# Patient Record
Sex: Male | Born: 1970 | Hispanic: No | Marital: Married | Smoking: Former smoker
Health system: Southern US, Community
[De-identification: ages and names within clinical notes are randomized; demographics above are authoritative.]

## PROBLEM LIST (undated history)

## (undated) DIAGNOSIS — IMO0001 Reserved for inherently not codable concepts without codable children: Secondary | ICD-10-CM

## (undated) DIAGNOSIS — I10 Essential (primary) hypertension: Secondary | ICD-10-CM

## (undated) DIAGNOSIS — K219 Gastro-esophageal reflux disease without esophagitis: Secondary | ICD-10-CM

## (undated) DIAGNOSIS — F329 Major depressive disorder, single episode, unspecified: Secondary | ICD-10-CM

## (undated) DIAGNOSIS — F32A Depression, unspecified: Secondary | ICD-10-CM

## (undated) HISTORY — DX: Essential (primary) hypertension: I10

## (undated) HISTORY — DX: Gastro-esophageal reflux disease without esophagitis: K21.9

## (undated) HISTORY — DX: Major depressive disorder, single episode, unspecified: F32.9

## (undated) HISTORY — DX: Depression, unspecified: F32.A

## (undated) HISTORY — DX: Reserved for inherently not codable concepts without codable children: IMO0001

---

## 2010-07-18 ENCOUNTER — Ambulatory Visit (HOSPITAL_COMMUNITY): Payer: Self-pay | Admitting: Licensed Clinical Social Worker

## 2010-07-22 ENCOUNTER — Ambulatory Visit (HOSPITAL_COMMUNITY): Payer: Self-pay | Admitting: Licensed Clinical Social Worker

## 2010-07-29 ENCOUNTER — Ambulatory Visit (HOSPITAL_COMMUNITY): Payer: Self-pay | Admitting: Licensed Clinical Social Worker

## 2010-08-05 ENCOUNTER — Ambulatory Visit (HOSPITAL_COMMUNITY): Payer: Self-pay | Admitting: Licensed Clinical Social Worker

## 2010-08-26 ENCOUNTER — Ambulatory Visit (HOSPITAL_COMMUNITY): Payer: Self-pay | Admitting: Licensed Clinical Social Worker

## 2010-09-09 ENCOUNTER — Ambulatory Visit (HOSPITAL_COMMUNITY): Payer: Self-pay | Admitting: Licensed Clinical Social Worker

## 2010-09-23 ENCOUNTER — Ambulatory Visit (HOSPITAL_COMMUNITY): Payer: Self-pay | Admitting: Licensed Clinical Social Worker

## 2010-10-08 ENCOUNTER — Ambulatory Visit (HOSPITAL_COMMUNITY): Admit: 2010-10-08 | Payer: Self-pay | Admitting: Licensed Clinical Social Worker

## 2010-10-22 ENCOUNTER — Ambulatory Visit (HOSPITAL_COMMUNITY)
Admission: RE | Admit: 2010-10-22 | Discharge: 2010-10-22 | Payer: Self-pay | Source: Home / Self Care | Attending: Licensed Clinical Social Worker | Admitting: Licensed Clinical Social Worker

## 2011-09-04 ENCOUNTER — Encounter (HOSPITAL_COMMUNITY): Payer: Self-pay | Admitting: Licensed Clinical Social Worker

## 2011-09-04 ENCOUNTER — Ambulatory Visit (INDEPENDENT_AMBULATORY_CARE_PROVIDER_SITE_OTHER): Payer: 59 | Admitting: Licensed Clinical Social Worker

## 2011-09-04 DIAGNOSIS — F329 Major depressive disorder, single episode, unspecified: Secondary | ICD-10-CM

## 2011-09-04 DIAGNOSIS — F3289 Other specified depressive episodes: Secondary | ICD-10-CM

## 2011-09-04 DIAGNOSIS — F32A Depression, unspecified: Secondary | ICD-10-CM

## 2011-09-04 NOTE — Patient Instructions (Signed)
Discussed different ways to deal with children       Choice      Set consequences      Lower Discipline role      Increase fun times.      When stressed talk with wife about switching roles Anger issues are around control       Triggers - continue to define        Powerlessness        Feeling disregarded Work on ways to walk away

## 2011-09-11 ENCOUNTER — Ambulatory Visit (HOSPITAL_COMMUNITY): Payer: Self-pay | Admitting: Licensed Clinical Social Worker

## 2011-09-15 DIAGNOSIS — F32A Depression, unspecified: Secondary | ICD-10-CM | POA: Insufficient documentation

## 2011-09-15 DIAGNOSIS — F329 Major depressive disorder, single episode, unspecified: Secondary | ICD-10-CM | POA: Insufficient documentation

## 2011-09-15 NOTE — Progress Notes (Signed)
   THERAPIST PROGRESS NOTE  Session Time: 9:08 - 10:00  Participation Level: Active  Behavioral Response: Fairly GroomedAlertAnxious and Depressed  Type of Therapy: Individual Therapy  Treatment Goals addressed: Anger and Anxiety  Interventions: Supportive, Anger Management Training, Family Systems and Reframing  Summary: Eddie Galvan is a 40 y.o. male who presents with anxiety and concern about his behavior with the children especially his son. He was in therapy with this counselor from 06/2010 - 09/2010.  He had gotten what he needed at the time and was doing better.  Had planned to return if needs arose.  He is returning for he finds himself having difficulty with his anger again.  He is getting stuck in the disciplinary role so we discussed getting away from that for awhile - discuss some changes with wife.  He needs to figure out ways to have fun with the children.  He is always feeling responsible and having to get control.Marland Kitchen  His triggers for anger are feeling powerless, disregarded - all around feeling out of control.  Discussed when he is stressed have wife handle discipline.  Talk to children about choice you "can do or have this" or "lose this".  Set up consequences ahead of time - work with wife around rules and consequences - he is disciplining too much off the top of his head.  It is ok to leave and tell them they will get their consequence later - sitting with discomfort never hurt anyone.  He has to work on keeping himself under control.  Increase fun activities with children.  Given list of instructions.   Suicidal/Homicidal: Nowithout intent/plan  Plan: Return again in 1 weeks.  Diagnosis: Axis I: Depressive Disorder NOS    Axis II: Deferred    Stepehn Eckard,JUDITH A, LCSW 09/15/2011

## 2011-10-01 ENCOUNTER — Ambulatory Visit (INDEPENDENT_AMBULATORY_CARE_PROVIDER_SITE_OTHER): Payer: 59 | Admitting: Licensed Clinical Social Worker

## 2011-10-01 ENCOUNTER — Encounter (HOSPITAL_COMMUNITY): Payer: Self-pay | Admitting: Licensed Clinical Social Worker

## 2011-10-01 DIAGNOSIS — F329 Major depressive disorder, single episode, unspecified: Secondary | ICD-10-CM

## 2011-10-01 NOTE — Patient Instructions (Signed)
Stop attending to negative benaviors Attend to positive behaviors Give him a special place in family - the tech guru Work on staying present -  When younger kids tattle - do not respond - if the behaviors are worth consequences - give to all involved
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
## 2011-12-29 ENCOUNTER — Ambulatory Visit (HOSPITAL_COMMUNITY): Payer: Self-pay | Admitting: Licensed Clinical Social Worker

## 2014-05-01 ENCOUNTER — Ambulatory Visit (INDEPENDENT_AMBULATORY_CARE_PROVIDER_SITE_OTHER): Payer: BC Managed Care – PPO | Admitting: Psychiatry

## 2014-05-01 ENCOUNTER — Encounter (HOSPITAL_COMMUNITY): Payer: Self-pay | Admitting: Psychiatry

## 2014-05-01 ENCOUNTER — Encounter (INDEPENDENT_AMBULATORY_CARE_PROVIDER_SITE_OTHER): Payer: Self-pay

## 2014-05-01 VITALS — BP 150/90 | HR 90 | Ht 70.5 in | Wt 203.0 lb

## 2014-05-01 DIAGNOSIS — F339 Major depressive disorder, recurrent, unspecified: Secondary | ICD-10-CM | POA: Insufficient documentation

## 2014-05-01 MED ORDER — CITALOPRAM HYDROBROMIDE 20 MG PO TABS: ORAL_TABLET | ORAL | Status: DC
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
## 2014-05-01 NOTE — Patient Instructions (Signed)
Caution about headache and nausea in the beginning. Also recommend therapy.

## 2014-05-22 ENCOUNTER — Ambulatory Visit (INDEPENDENT_AMBULATORY_CARE_PROVIDER_SITE_OTHER): Payer: BC Managed Care – PPO | Admitting: Psychiatry

## 2014-05-22 ENCOUNTER — Encounter (HOSPITAL_COMMUNITY): Payer: Self-pay | Admitting: Psychiatry

## 2014-05-22 VITALS — BP 150/90 | HR 86 | Ht 70.0 in | Wt 202.0 lb

## 2014-05-22 DIAGNOSIS — F331 Major depressive disorder, recurrent, moderate: Secondary | ICD-10-CM

## 2014-05-22 DIAGNOSIS — F339 Major depressive disorder, recurrent, unspecified: Secondary | ICD-10-CM

## 2014-05-22 MED ORDER — CITALOPRAM HYDROBROMIDE 20 MG PO TABS: ORAL_TABLET | ORAL | Status: DC

## 2014-05-22 NOTE — Progress Notes (Signed)
Patient ID: Eddie Galvan, male   DOB: 11-18-1970, 43 y.o.   MRN: [id number] [facility] Follow-up Outpatient Visit  Eddie Galvan [id number] 43 y.o.  05/22/2014  Chief Complaint: Depression follow up.     History of Present Illness:   Patient returns for Medication Follow up and is diagnosed with Depression.  Patient was having conflicts at home. Relationship is getting distant. He was getting angry and frustrated for which he came into appointment we have started on Celexa last visit. Things are getting better as Desitin down together and talking about the conflicts and finances. He believes medication is helping and there are no reported side effects. Energy level, appetite, concentration and sleep are adequate Context of depression related to the current relationship and family psychosocial issues. Timing of depression is variable now.  . Severity of depression improved to  7/10.  In the past he is self-medicating himself when he was young during his teenage years alcohol marijuana as of now is not using alcohol on a regular basis. He has not used marijuana for a long time.  He endorses having some flashbacks or memories about the past from his childhood when he was growing because they were mean to him. He feels sometimes he gets angry at the kids but does not want to be like that kind of experiences that he has had when he was young  No current panic symptoms does worry a lot but does not endorse hopelessness. Does not endorse hallucinations or delusions. Does not endorse having racing thoughts the point of having manic symptoms or psychotic symptoms or paranoia.   Past Medical History  Diagnosis Date  . Hypertension   . Reflux   . Depression   . GERD (gastroesophageal reflux disease)   . High blood pressure    Family History  Problem Relation Age of Onset  . Alcohol abuse Maternal Grandmother   . Depression Paternal Grandfather   . Depression Brother   .  Anxiety disorder Brother     Outpatient Encounter Prescriptions as of 05/22/2014  Medication Sig  . aspirin 81 MG tablet Take 81 mg by mouth daily.  . citalopram (CELEXA) 20 MG tablet Take one tablet a day.  . lisinopril (PRINIVIL,ZESTRIL) 10 MG tablet Take 10 mg by mouth daily.  Marland Kitchen omeprazole (PRILOSEC) 10 MG capsule Take 10 mg by mouth daily.  . [DISCONTINUED] citalopram (CELEXA) 20 MG tablet Take half table first 5 days then one day.    No results found for this or any previous visit (from the past 2160 hour(s)).  BP 150/90  Pulse 86  Ht  (1.778 m)  Wt 202 lb (91.627 kg)  BMI 28.98 kg/m2   Review of Systems  Constitutional: Negative.   Neurological: Negative for dizziness and tremors.  Psychiatric/Behavioral: Positive for depression. Negative for suicidal ideas, hallucinations and substance abuse. The patient is nervous/anxious.     Mental Status Examination  Appearance: casual Alert: Yes Attention: fair  Cooperative: Yes Eye Contact: Good Speech: normal  Psychomotor Activity: Normal Memory/Concentration: reasonable Oriented: person, place and time/date Mood: Euthymic Affect: Congruent Thought Processes and Associations: Intact Fund of Knowledge: Fair Thought Content: Suicidal ideation and Homicidal ideation were denied. Insight: Fair Judgement: Fair  Diagnosis: Maj. depressive disorder recurrent moderate.  Treatment Plan:   Continue Celexa 10 mg. Discussed to avail opportunity attend therapy. He is correct in therapy at [facility]. Follow up with his primary care physician regarding his blood pressure.  Pertinent Labs and Relevant  Prior Notes reviewed. Medication Side effects, benefits and risks reviewed/discussed with Patient. Time given for patient to respond and asks questions regarding the Diagnosis and Medications. Safety concerns and to report to ER if suicidal or call 911. Relevant Medications refilled or called in to  pharmacy. Discussed weight maintenance and Sleep Hygiene. Follow up with Primary care provider in regards to Medical conditions. Recommend compliance with medications and follow up office appointments. Discussed to avail opportunity to consider or/and continue Individual therapy with Counselor. Greater than 50% of time was spend in counseling and coordination of care with the patient.  Schedule for Follow up visit in 8 weeks or call in earlier as necessary.  Thresa Ross, MD 05/22/2014

## 2014-07-23 ENCOUNTER — Ambulatory Visit (HOSPITAL_COMMUNITY): Payer: Self-pay | Admitting: Psychiatry

## 2014-07-24 ENCOUNTER — Encounter (INDEPENDENT_AMBULATORY_CARE_PROVIDER_SITE_OTHER): Payer: Self-pay

## 2014-07-24 ENCOUNTER — Ambulatory Visit (INDEPENDENT_AMBULATORY_CARE_PROVIDER_SITE_OTHER): Payer: BC Managed Care – PPO | Admitting: Psychiatry

## 2014-07-24 ENCOUNTER — Encounter (HOSPITAL_COMMUNITY): Payer: Self-pay | Admitting: Psychiatry

## 2014-07-24 VITALS — BP 150/96 | HR 75 | Ht 70.5 in | Wt 211.0 lb

## 2014-07-24 DIAGNOSIS — F331 Major depressive disorder, recurrent, moderate: Secondary | ICD-10-CM

## 2014-07-24 MED ORDER — CITALOPRAM HYDROBROMIDE 20 MG PO TABS: ORAL_TABLET | ORAL | Status: DC

## 2014-07-24 NOTE — Progress Notes (Signed)
Patient ID: Eddie Galvan [place]ep[place], male   DOB: 10/08/1970, 43 y.o.   MRN: [id number] [facility] Follow-up Outpatient Visit  Eddie Galvan Breach [id number] 43 y.o.  07/24/2014  Chief Complaint: Depression follow up.     History of Present Illness:   Patient returns for Medication Follow up and is diagnosed with Depression.  Patient was having conflicts at home. Relationship is getting distant. He was getting angry and frustrated for which he came into appointment we have started on Celexa dose of which is now 20mg . Things are getting better  about the conflicts and finances. He believes medication is helping and there are no reported side effects. Energy level, appetite, concentration and sleep are adequate Context of depression related to the current relationship and family psychosocial issues. Timing of depression is variable now.  . Severity of depression improved to  7/10.  In the past he is self-medicating himself when he was young during his teenage years alcohol marijuana as of now is not using alcohol on a regular basis. He has not used marijuana for a long time.  He endorses having some flashbacks or memories about the past from his childhood when he was growing because they were mean to him. He feels sometimes he gets angry at the kids but does not want to be like that kind of experiences that he has had when he was young  No current panic symptoms does worry a lot but does not endorse hopelessness. Does not endorse hallucinations or delusions. Does not endorse having racing thoughts the point of having manic symptoms or psychotic symptoms or paranoia.   Past Medical History  Diagnosis Date  . Hypertension   . Reflux   . Depression   . GERD (gastroesophageal reflux disease)   . High blood pressure    Family History  Problem Relation Age of Onset  . Alcohol abuse Maternal Grandmother   . Depression Paternal Grandfather   . Depression Brother   . Anxiety disorder Brother      Outpatient Encounter Prescriptions as of 07/24/2014  Medication Sig  . aspirin 81 MG tablet Take 81 mg by mouth daily.  . citalopram (CELEXA) 20 MG tablet Take one tablet a day.  . lisinopril (PRINIVIL,ZESTRIL) 10 MG tablet Take 10 mg by mouth daily.  Marland Kitchen. omeprazole (PRILOSEC) 10 MG capsule Take 10 mg by mouth daily.  . [DISCONTINUED] citalopram (CELEXA) 20 MG tablet Take one tablet a day.    No results found for this or any previous visit (from the past 2160 hour(s)).  BP 150/96  Pulse 75  Ht 5' 10.5" (1.791 m)  Wt 211 lb (95.709 kg)  BMI 29.84 kg/m2   Review of Systems  Constitutional: Negative.   Neurological: Negative for dizziness and tremors.  Psychiatric/Behavioral: Negative for depression, suicidal ideas, hallucinations and substance abuse. The patient is not nervous/anxious.     Mental Status Examination  Appearance: casual Alert: Yes Attention: fair  Cooperative: Yes Eye Contact: Good Speech: normal  Psychomotor Activity: Normal Memory/Concentration: reasonable Oriented: person, place and time/date Mood: Euthymic Affect: Congruent Thought Processes and Associations: Intact Fund of Knowledge: Fair Thought Content: Suicidal ideation and Homicidal ideation were denied. Insight: Fair Judgement: Fair  Diagnosis: Maj. depressive disorder recurrent moderate.  Treatment Plan:   Continue Celexa 20 mg. Discussed to avail opportunity attend therapy. He is correct in therapy at [facility]. Follow up with his primary care physician regarding his blood pressure. [facility] and is helping  with conflicts at home.  Pertinent Labs and Relevant Prior Notes reviewed. Medication Side effects, benefits and risks reviewed/discussed with Patient. Time given for patient to respond and asks questions regarding the Diagnosis and Medications. Safety concerns and to report to ER if suicidal or call 911. Relevant  Medications refilled or called in to pharmacy. Discussed weight maintenance and Sleep Hygiene. Follow up with Primary care provider in regards to Medical conditions. Recommend compliance with medications and follow up office appointments. Discussed to avail opportunity to consider or/and continue Individual therapy with Counselor. Greater than 50% of time was spend in counseling and coordination of care with the patient.  Schedule for Follow up visit in 8 weeks or call in earlier as necessary.  Thresa RossAKHTAR, Adrie Picking, MD 07/24/2014

## 2014-10-02 ENCOUNTER — Encounter (INDEPENDENT_AMBULATORY_CARE_PROVIDER_SITE_OTHER): Payer: Self-pay

## 2014-10-02 ENCOUNTER — Encounter (HOSPITAL_COMMUNITY): Payer: Self-pay | Admitting: Psychiatry

## 2014-10-02 ENCOUNTER — Ambulatory Visit (INDEPENDENT_AMBULATORY_CARE_PROVIDER_SITE_OTHER): Payer: BLUE CROSS/BLUE SHIELD | Admitting: Psychiatry

## 2014-10-02 VITALS — BP 140/88 | HR 88 | Wt 204.0 lb

## 2014-10-02 DIAGNOSIS — F331 Major depressive disorder, recurrent, moderate: Secondary | ICD-10-CM

## 2014-10-02 MED ORDER — CITALOPRAM HYDROBROMIDE 20 MG PO TABS: ORAL_TABLET | ORAL | Status: DC

## 2014-10-02 NOTE — Progress Notes (Signed)
Patient ID: Eddie StanfordJonathon Galvan, male   DOB: 07/22/1971, 44 y.o.   MRN: [id number] [facility]  Eddie StanfordJonathon Galvan [id number] 44 y.o.  10/02/2014  Chief Complaint: Depression follow up.     History of Present Illness:   Patient returns for Medication Follow up and is diagnosed with Depression.  Patient was having conflicts at home. Relationship is getting distant. He was getting angry and frustrated for which he came into appointment we have started on Celexa dose of which is now 20mg . Things are getting better  about the conflicts and finances. He believes medication is helping and there are no reported side effects. Energy level, appetite, concentration and sleep are adequate. Continues to do better and seeing an outside therapist.  Context of depression related to the current relationship and family psychosocial issues. Timing of depression is variable now.  . Severity of depression improved to  7/10. 10 being no depression.   In the past he is self-medicating himself when he was young during his teenage years alcohol marijuana as of now is not using alcohol on a regular basis. He has not used marijuana for a long time.  He endorses having some flashbacks or memories about the past from his childhood when he was growing because they were mean to him. He feels sometimes he gets angry at the kids but does not want to be like that kind of experiences that he has had when he was young  No current panic symptoms does worry a lot but does not endorse hopelessness. Does not endorse hallucinations or delusions. Does not endorse having racing thoughts the point of having manic symptoms or psychotic symptoms or paranoia.   Past Medical History  Diagnosis Date  . Hypertension   . Reflux   . Depression   . GERD (gastroesophageal reflux disease)   . High blood pressure    Family History  Problem Relation Age of Onset  . Alcohol abuse Maternal Grandmother   .  Depression Paternal Grandfather   . Depression Brother   . Anxiety disorder Brother     Outpatient Encounter Prescriptions as of 10/02/2014  Medication Sig  . aspirin 81 MG tablet Take 81 mg by mouth daily.  . citalopram (CELEXA) 20 MG tablet Take one tablet a day.  . lisinopril (PRINIVIL,ZESTRIL) 10 MG tablet Take 10 mg by mouth daily.  Marland Kitchen. omeprazole (PRILOSEC) 10 MG capsule Take 10 mg by mouth daily.  . [DISCONTINUED] citalopram (CELEXA) 20 MG tablet Take one tablet a day.    No results found for this or any previous visit (from the past 2160 hour(s)).  BP 140/88 mmHg  Pulse 88  Wt 204 lb (92.534 kg)   Review of Systems  Constitutional: Negative.   Neurological: Negative for dizziness and tremors.  Psychiatric/Behavioral: Negative for depression, suicidal ideas, hallucinations and substance abuse. The patient is not nervous/anxious.    GI: no nausea, vomiting Mental Status Examination  Appearance: casual Alert: Yes Attention: fair  Cooperative: Yes Eye Contact: Good Speech: normal  Psychomotor Activity: Normal Memory/Concentration: reasonable Oriented: person, place and time/date Mood: Euthymic Affect: Congruent Thought Processes and Associations: Intact Fund of Knowledge: Fair Thought Content: Suicidal ideation and Homicidal ideation were denied. Insight: Fair Judgement: Fair  Diagnosis: Maj. depressive disorder recurrent moderate.  Treatment Plan:   Continue Celexa 20 mg. Discussed to avail opportunity attend therapy. He is correct in therapy at [facility]. Follow up with his primary care physician regarding his blood pressure. He has  started therapy with [facility] and is helping with conflicts at home.  Advised not to stop meds.  Pertinent Labs and Relevant Prior Notes reviewed. Medication Side effects, benefits and risks reviewed/discussed with Patient. Time given for patient to respond and asks questions regarding the  Diagnosis and Medications. Safety concerns and to report to ER if suicidal or call 911. Relevant Medications refilled or called in to pharmacy. Discussed weight maintenance and Sleep Hygiene. Follow up with Primary care provider in regards to Medical conditions. Recommend compliance with medications and follow up office appointments. Discussed to avail opportunity to consider or/and continue Individual therapy with Counselor. Greater than 50% of time was spend in counseling and coordination of care with the patient.  Schedule for Follow up visit in 8 weeks or call in earlier as necessary.  Thresa Ross, MD 10/02/2014

## 2014-12-03 ENCOUNTER — Ambulatory Visit (HOSPITAL_COMMUNITY): Payer: Self-pay | Admitting: Psychiatry

## 2014-12-10 ENCOUNTER — Encounter (HOSPITAL_COMMUNITY): Payer: Self-pay | Admitting: Psychiatry

## 2014-12-10 ENCOUNTER — Ambulatory Visit (INDEPENDENT_AMBULATORY_CARE_PROVIDER_SITE_OTHER): Payer: BLUE CROSS/BLUE SHIELD | Admitting: Psychiatry

## 2014-12-10 ENCOUNTER — Encounter (INDEPENDENT_AMBULATORY_CARE_PROVIDER_SITE_OTHER): Payer: Self-pay

## 2014-12-10 VITALS — BP 142/90 | HR 83 | Ht 70.5 in | Wt 214.0 lb

## 2014-12-10 DIAGNOSIS — F331 Major depressive disorder, recurrent, moderate: Secondary | ICD-10-CM | POA: Diagnosis not present

## 2014-12-10 MED ORDER — CITALOPRAM HYDROBROMIDE 20 MG PO TABS: ORAL_TABLET | ORAL | Status: DC

## 2014-12-10 NOTE — Progress Notes (Signed)
Patient ID: Eddie Galvan, male   DOB: 12-Jun-1971, 44 y.o.   MRN: [id number] [facility] Follow-up Outpatient Visit  Eddie Galvan [id number] 44 y.o.  12/10/2014  Chief Complaint: Depression follow up.     History of Present Illness:   Patient returns for Medication Follow up and is diagnosed with Depression.  Patient was having conflicts at home which was leading to depression. He is now on Celexa at a dose of 20 mg that is helping his energy level is improved.  Some sleep issues but they make a referral for sleep study. Overall depression was improved with no reported side effects. Energy level, appetite, concentration and sleep are adequate. Continues to do better and seeing an outside therapist.  Context of depression related to the current relationship and family psychosocial issues. Timing of depression is variable now.  . Severity of depression improved to  7/10. 10 being no depression.   In the past he is self-medicating himself when he was young during his teenage years alcohol marijuana as of now is not using alcohol on a regular basis. He has not used marijuana for a long time.  He endorses having some flashbacks or memories about the past from his childhood when he was growing because they were mean to him. He feels sometimes he gets angry at the kids but does not want to be like that kind of experiences that he has had when he was young  No current panic symptoms does worry a lot but does not endorse hopelessness. Does not endorse hallucinations or delusions. Does not endorse having racing thoughts the point of having manic symptoms or psychotic symptoms or paranoia.   Past Medical History  Diagnosis Date  . Hypertension   . Reflux   . Depression   . GERD (gastroesophageal reflux disease)   . High blood pressure    Family History  Problem Relation Age of Onset  . Alcohol abuse Maternal Grandmother   . Depression Paternal Grandfather   . Depression  Brother   . Anxiety disorder Brother     Outpatient Encounter Prescriptions as of 12/10/2014  Medication Sig  . aspirin 81 MG tablet Take 81 mg by mouth daily.  . citalopram (CELEXA) 20 MG tablet Take one tablet a day.  . lisinopril (PRINIVIL,ZESTRIL) 10 MG tablet Take 10 mg by mouth daily.  Marland Kitchen omeprazole (PRILOSEC) 10 MG capsule Take 10 mg by mouth daily.  . [DISCONTINUED] citalopram (CELEXA) 20 MG tablet Take one tablet a day.    No results found for this or any previous visit (from the past 2160 hour(s)).  BP 142/90 mmHg  Pulse 83  Ht 5' 10.5" (1.791 m)  Wt 214 lb (97.07 kg)  BMI 30.26 kg/m2   Review of Systems  Constitutional: Negative.   Neurological: Negative for dizziness and tremors.  Psychiatric/Behavioral: Negative for depression, suicidal ideas, hallucinations and substance abuse. The patient is not nervous/anxious.    GI: no nausea, vomiting Mental Status Examination  Appearance: casual Alert: Yes Attention: fair  Cooperative: Yes Eye Contact: Good Speech: normal  Psychomotor Activity: Normal Memory/Concentration: reasonable Oriented: person, place and time/date Mood: Euthymic Affect: Congruent Thought Processes and Associations: Intact Fund of Knowledge: Fair Thought Content: Suicidal ideation and Homicidal ideation were denied. Insight: Fair Judgement: Fair  Diagnosis: Maj. depressive disorder recurrent moderate.  Treatment Plan:   Continue Celexa 20 mg. Discussed to avail opportunity attend therapy which he started .  He is currently  in therapy at [facility]. Follow  up with his primary care physician regarding his blood pressure. [facility] and is helping with conflicts at home.  Advised not to stop meds.  Pertinent Labs and Relevant Prior Notes reviewed. Medication Side effects, benefits and risks reviewed/discussed with Patient. Time given for patient to respond and asks questions  regarding the Diagnosis and Medications. Safety concerns and to report to ER if suicidal or call 911. Relevant Medications refilled or called in to pharmacy. Discussed weight maintenance and Sleep Hygiene. Follow up with Primary care provider in regards to Medical conditions. Recommend compliance with medications and follow up office appointments. Discussed to avail opportunity to consider or/and continue Individual therapy with Counselor. Greater than 50% of time was spend in counseling and coordination of care with the patient.  Schedule for Follow up visit in 8 weeks or call in earlier as necessary.  Thresa RossAKHTAR, Lavaeh Bau, MD 12/10/2014

## 2015-02-18 ENCOUNTER — Other Ambulatory Visit: Payer: Self-pay | Admitting: Gastroenterology

## 2015-02-18 DIAGNOSIS — R945 Abnormal results of liver function studies: Principal | ICD-10-CM

## 2015-02-18 DIAGNOSIS — R7989 Other specified abnormal findings of blood chemistry: Secondary | ICD-10-CM

## 2015-02-22 ENCOUNTER — Other Ambulatory Visit: Payer: Self-pay

## 2015-03-07 ENCOUNTER — Ambulatory Visit
Admission: RE | Admit: 2015-03-07 | Discharge: 2015-03-07 | Disposition: A | Discharge status: Home / Self Care | Payer: BLUE CROSS/BLUE SHIELD | Source: Ambulatory Visit | Attending: Gastroenterology | Admitting: Gastroenterology

## 2015-03-07 DIAGNOSIS — R945 Abnormal results of liver function studies: Principal | ICD-10-CM

## 2015-03-07 DIAGNOSIS — R7989 Other specified abnormal findings of blood chemistry: Secondary | ICD-10-CM

## 2015-03-18 ENCOUNTER — Telehealth (HOSPITAL_COMMUNITY): Payer: Self-pay | Admitting: *Deleted

## 2015-03-18 MED ORDER — CITALOPRAM HYDROBROMIDE 20 MG PO TABS: ORAL_TABLET | ORAL | Status: DC

## 2015-03-18 NOTE — Telephone Encounter (Signed)
Pt called for a refill for Celexa 20mg . Per Dr. Gilmore Laroche, pt is authorized for a refill for Celexa 20mg , Qty 30. Prescription was sent to [facility]. Pt has a f/u appt on 7/19. Called and informed pt of prescription status. Pt states and show understanding.

## 2015-03-19 ENCOUNTER — Ambulatory Visit (HOSPITAL_COMMUNITY): Payer: Self-pay | Admitting: Psychiatry

## 2015-04-16 ENCOUNTER — Ambulatory Visit (INDEPENDENT_AMBULATORY_CARE_PROVIDER_SITE_OTHER): Payer: BLUE CROSS/BLUE SHIELD | Admitting: Psychiatry

## 2015-04-16 ENCOUNTER — Encounter (HOSPITAL_COMMUNITY): Payer: Self-pay | Admitting: Psychiatry

## 2015-04-16 VITALS — HR 82 | Ht 70.0 in | Wt 217.0 lb

## 2015-04-16 DIAGNOSIS — G4700 Insomnia, unspecified: Secondary | ICD-10-CM | POA: Diagnosis not present

## 2015-04-16 DIAGNOSIS — Z630 Problems in relationship with spouse or partner: Secondary | ICD-10-CM | POA: Diagnosis not present

## 2015-04-16 DIAGNOSIS — F331 Major depressive disorder, recurrent, moderate: Secondary | ICD-10-CM | POA: Diagnosis not present

## 2015-04-16 DIAGNOSIS — Z639 Problem related to primary support group, unspecified: Secondary | ICD-10-CM

## 2015-04-16 MED ORDER — CITALOPRAM HYDROBROMIDE 20 MG PO TABS: ORAL_TABLET | ORAL | Status: AC

## 2015-04-16 NOTE — Progress Notes (Signed)
Patient ID: Eddie Galvan [place], male   DOB: 04/28/1971, 44 y.o.   MRN: [id number] [facility]  Eddie Galvan [place] [id number] 44 y.o.  04/16/2015  Chief Complaint: Depression follow up and medication refills     History of Present Illness:   Patient returns for Medication Follow up and is diagnosed with Depression.   Patient was having conflicts at home which was leading to depression and insomnia.  He is now on Celexa at a dose of 20 mg that is helping his energy level is improved.  Some sleep issues but they make a referral for sleep study. Overall depression was improved with no reported side effects. Energy level, appetite, concentration and sleep are adequate. Continues to do better and seeing an outside therapist.  Context of depression related to the current relationship and family psychosocial issues. Timing of depression is variable now.  . Severity of depression improved to  7/10. 10 being no depression.  Modifying factors; support system Aggravating factors: relationship stress but improving  In the past he is self-medicating himself when he was young during his teenage years alcohol marijuana as of now is not using alcohol on a regular basis. He has not used marijuana for a long time.  He endorses having some flashbacks or memories about the past from his childhood when he was growing because they were mean to him. He feels sometimes he gets angry at the kids but does not want to be like that kind of experiences that he has had when he was young  No current panic symptoms does worry a lot but does not endorse hopelessness. Does not endorse hallucinations or delusions. Does not endorse having racing thoughts the point of having manic symptoms or psychotic symptoms or paranoia.   Past Medical History  Diagnosis Date  . Hypertension   . Reflux   . Depression   . GERD (gastroesophageal reflux disease)   . High blood pressure    Family History   Problem Relation Age of Onset  . Alcohol abuse Maternal Grandmother   . Depression Paternal Grandfather   . Depression Brother   . Anxiety disorder Brother     Outpatient Encounter Prescriptions as of 04/16/2015  Medication Sig  . aspirin 81 MG tablet Take 81 mg by mouth daily.  . citalopram (CELEXA) 20 MG tablet Take one tablet a day.  . lisinopril (PRINIVIL,ZESTRIL) 10 MG tablet Take 10 mg by mouth daily.  Marland Kitchen. omeprazole (PRILOSEC) 10 MG capsule Take 10 mg by mouth daily.  . [DISCONTINUED] citalopram (CELEXA) 20 MG tablet Take one tablet a day.   No facility-administered encounter medications on file as of 04/16/2015.    No results found for this or any previous visit (from the past 2160 hour(s)).  Wt 217 lb (98.431 kg)   Review of Systems  Constitutional: Negative.   Neurological: Negative for dizziness and tremors. no headaches or nausea Psychiatric/Behavioral: Negative for depression, suicidal ideas, hallucinations and substance abuse. The patient is not nervous/anxious.    GI: no nausea, vomiting Mental Status Examination  Appearance: casual Alert: Yes Attention: fair  Cooperative: Yes Eye Contact: Good Speech: normal  Psychomotor Activity: Normal Memory/Concentration: reasonable Oriented: person, place and time/date Mood: Euthymic Affect: Congruent Thought Processes and Associations: Intact Fund of Knowledge: Fair Thought Content: Suicidal ideation and Homicidal ideation were denied. Insight: Fair Judgement: Fair  Diagnosis: Maj. depressive disorder recurrent moderate. Insomnia. Relationship problems  Treatment Plan:   Continue Celexa 20 mg. Discussed to avail opportunity attend  therapy which he started .  He has started therapy with [facility] and is helping with conflicts at home. Relationship is improving Advised not to stop meds.  Reviewed sleep hygiene for insomnia.   Pertinent Labs and Relevant Prior Notes reviewed. Medication Side  effects, benefits and risks reviewed/discussed with Patient. Time given for patient to respond and asks questions regarding the Diagnosis and Medications. Safety concerns and to report to ER if suicidal or call 911. Relevant Medications refilled or called in to pharmacy.  Follow up with Primary care provider in regards to Medical conditions. Recommend compliance with medications and follow up office appointments. Discussed to avail opportunity to consider or/and continue Individual therapy with Counselor. Greater than 50% of time was spend in counseling and coordination of care with the patient.  Schedule for Follow up visit in 8 weeks or call in earlier as necessary.  Thresa Ross, MD 04/16/2015

## 2015-07-16 ENCOUNTER — Ambulatory Visit (HOSPITAL_COMMUNITY): Payer: Self-pay | Admitting: Psychiatry

## 2016-04-11 IMAGING — US US ABDOMEN COMPLETE
1 series · 14 of 25 positions shown · non-contrast
Comparison: None.

CLINICAL DATA: Elevated LFTs

EXAM:
ULTRASOUND ABDOMEN COMPLETE

[Series 1: us abdomen complete · 0.32mm/px · 14 of 69 slices shown]
[im 1/69]
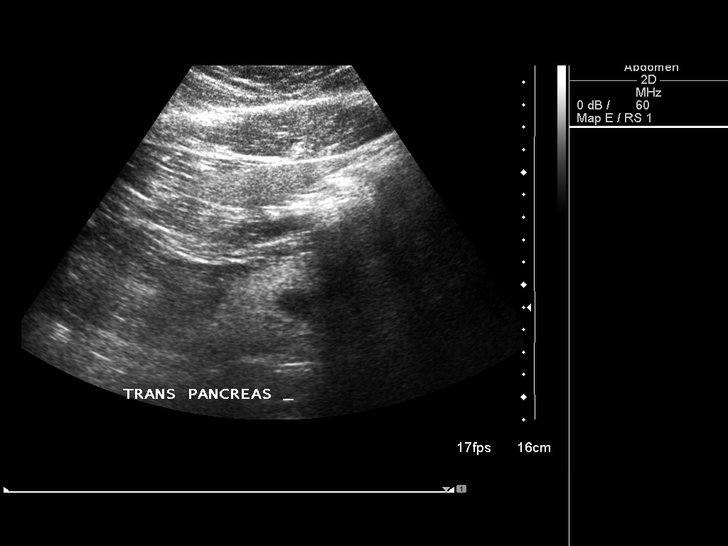
[im 6/69]
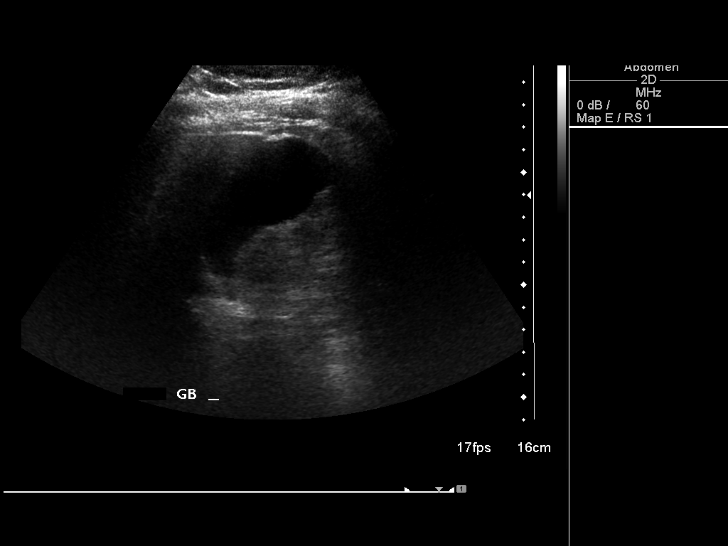
[im 12/69]
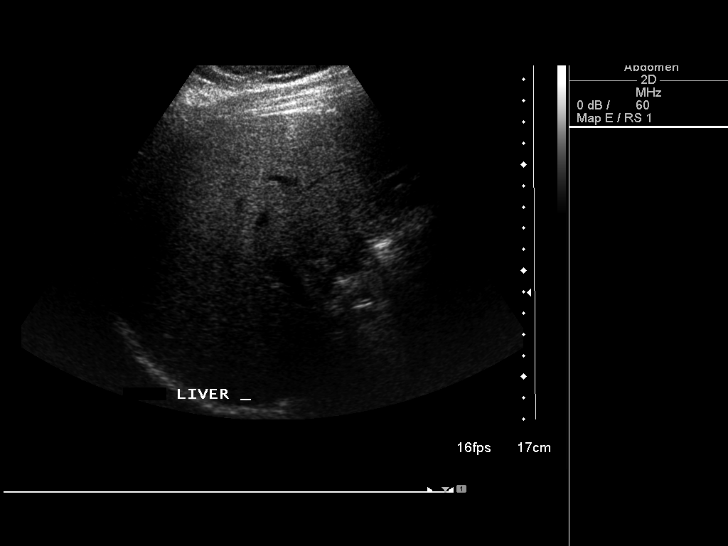
[im 18/69]
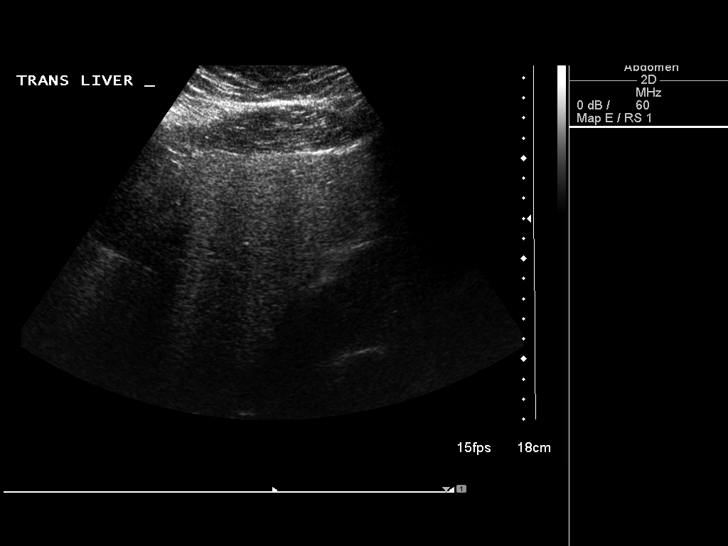
[im 23/69]
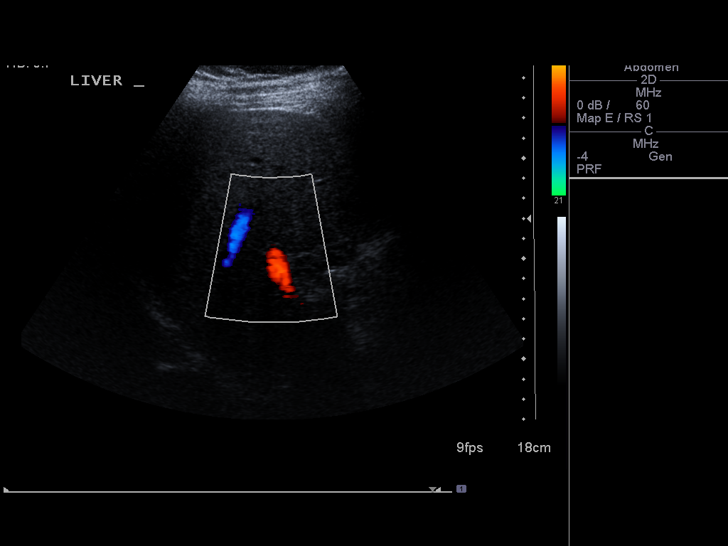
[im 26/69]
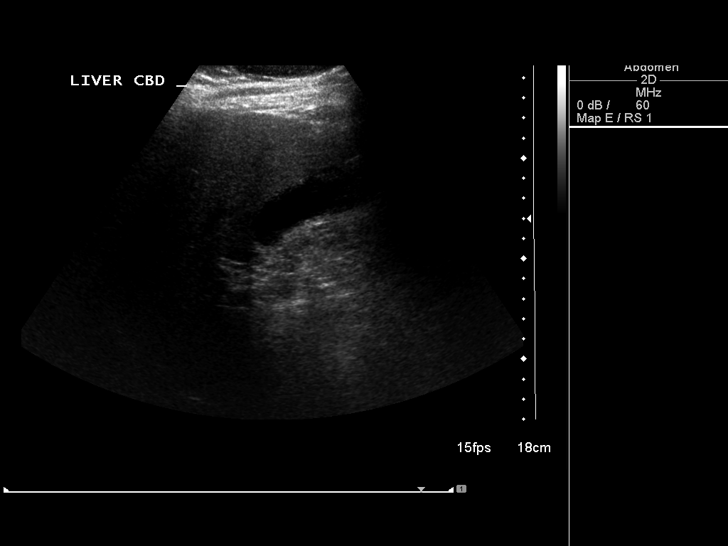
[im 32/69]
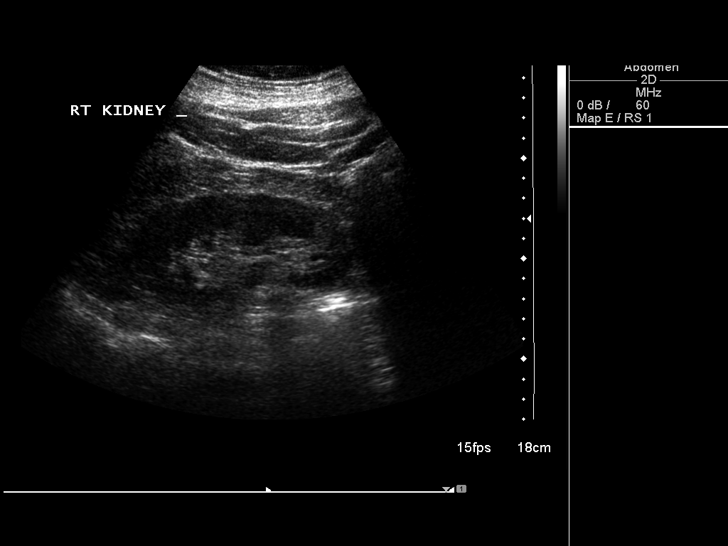
[im 37/69]
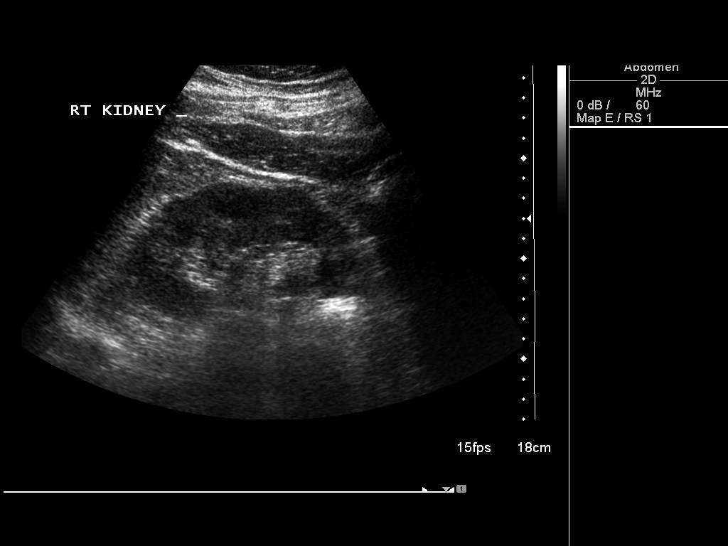
[im 43/69]
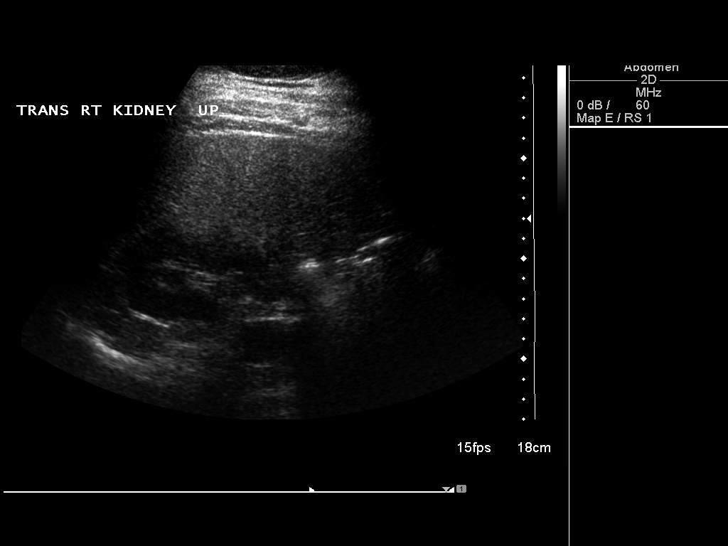
[im 46/69]
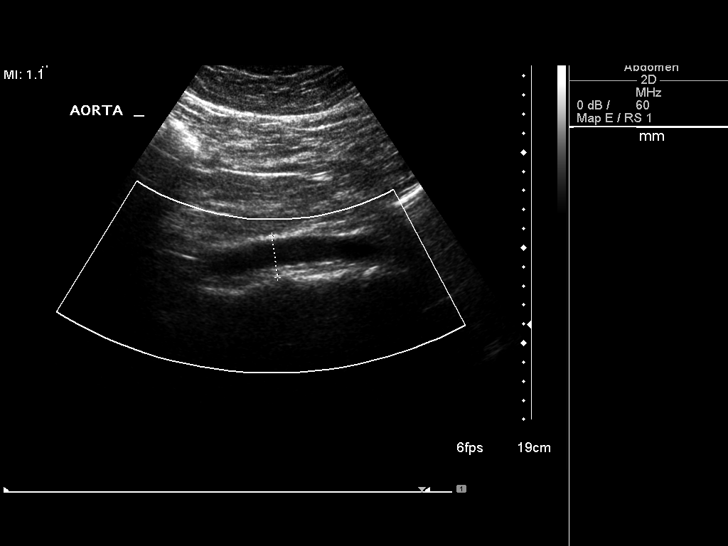
[im 52/69]
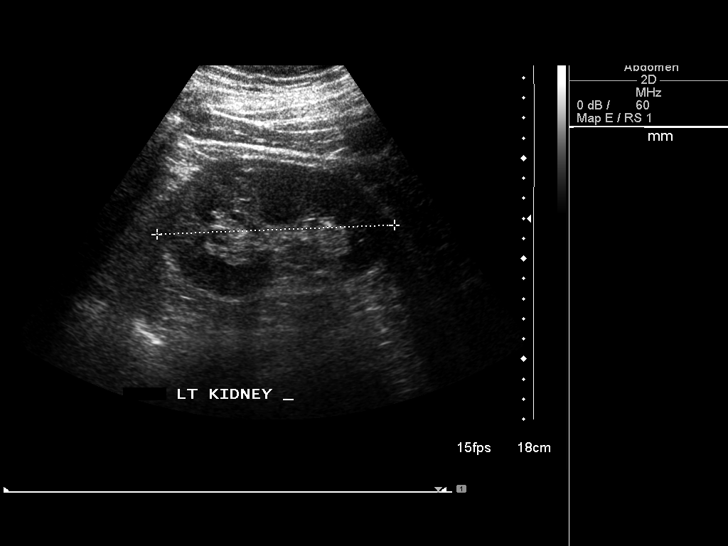
[im 57/69]
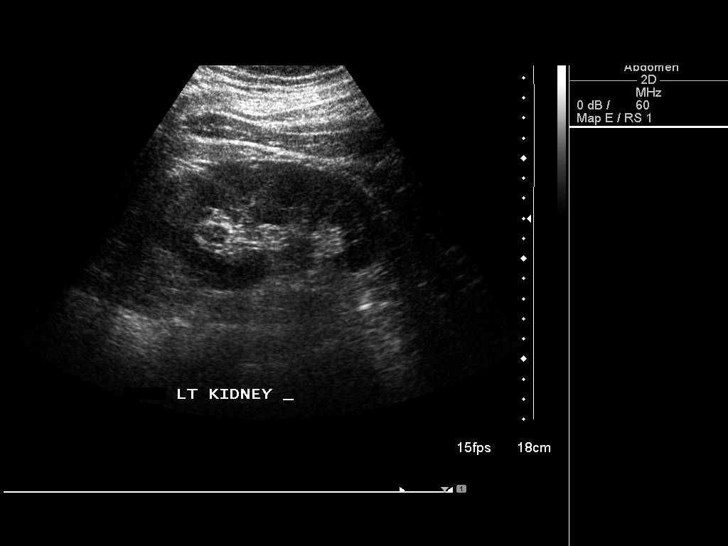
[im 63/69]
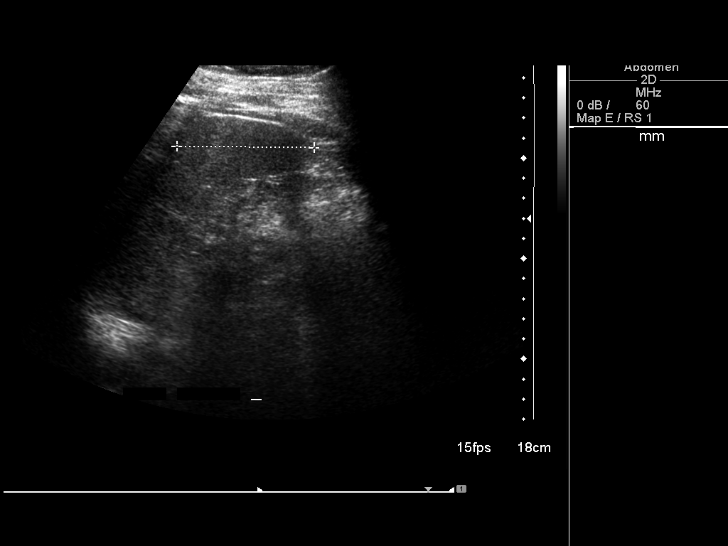
[im 69/69]
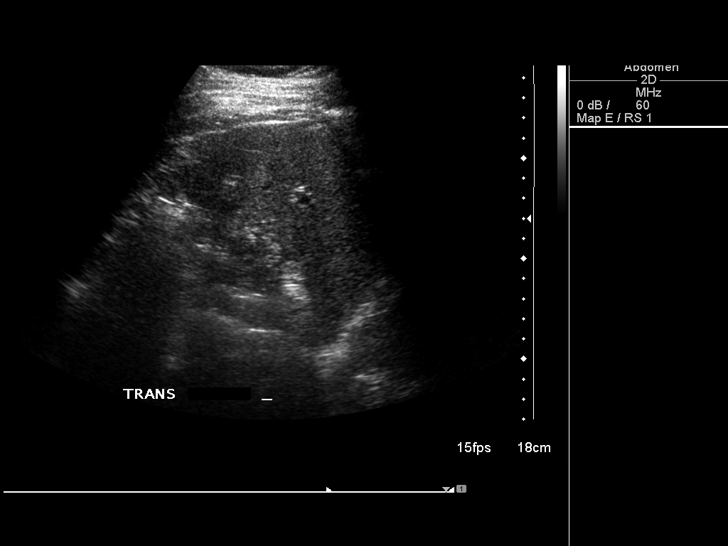

[14 of 25 positions shown; findings below may reference images not displayed]

FINDINGS: Gallbladder: No gallstones or wall thickening visualized. No
sonographic Murphy sign noted.

Common bile duct: Diameter: 4 mm

Liver: Mildly increased in echogenicity consistent with fatty
infiltration pre

IVC: No abnormality visualized.

Pancreas: Visualized portion unremarkable.

Spleen: Size and appearance within normal limits.

Right Kidney: Length: 11.5 cm. Echogenicity within normal limits. No
mass or hydronephrosis visualized.

Left Kidney: Length: 11.8 cm. Echogenicity within normal limits. No
mass or hydronephrosis visualized.

Abdominal aorta: No aneurysm visualized.

Other findings: None.
IMPRESSION: Fatty infiltration of the liver

No acute abnormality noted.
